# Patient Record
Sex: Female | Born: 1940 | Race: Black or African American | Hispanic: No | Marital: Single | Smoking: Never smoker
Health system: Southern US, Community
[De-identification: ages and names within clinical notes are randomized; demographics above are authoritative.]

## PROBLEM LIST (undated history)

## (undated) DIAGNOSIS — Z950 Presence of cardiac pacemaker: Secondary | ICD-10-CM

## (undated) DIAGNOSIS — I10 Essential (primary) hypertension: Secondary | ICD-10-CM

## (undated) DIAGNOSIS — E785 Hyperlipidemia, unspecified: Secondary | ICD-10-CM

## (undated) DIAGNOSIS — I2510 Atherosclerotic heart disease of native coronary artery without angina pectoris: Secondary | ICD-10-CM

## (undated) DIAGNOSIS — Z4502 Encounter for adjustment and management of automatic implantable cardiac defibrillator: Secondary | ICD-10-CM

## (undated) DIAGNOSIS — I509 Heart failure, unspecified: Secondary | ICD-10-CM

## (undated) DIAGNOSIS — N189 Chronic kidney disease, unspecified: Secondary | ICD-10-CM

## (undated) DIAGNOSIS — M8580 Other specified disorders of bone density and structure, unspecified site: Secondary | ICD-10-CM

## (undated) DIAGNOSIS — Z95810 Presence of automatic (implantable) cardiac defibrillator: Secondary | ICD-10-CM

## (undated) DIAGNOSIS — T79A3XA Traumatic compartment syndrome of abdomen, initial encounter: Secondary | ICD-10-CM

## (undated) DIAGNOSIS — E119 Type 2 diabetes mellitus without complications: Secondary | ICD-10-CM

## (undated) HISTORY — PX: INSERTION OF ICD: SHX6689

---

## 2006-02-24 ENCOUNTER — Emergency Department: Payer: Self-pay | Admitting: Emergency Medicine

## 2008-08-19 DIAGNOSIS — Z95810 Presence of automatic (implantable) cardiac defibrillator: Secondary | ICD-10-CM

## 2008-08-19 HISTORY — DX: Presence of automatic (implantable) cardiac defibrillator: Z95.810

## 2008-08-20 ENCOUNTER — Inpatient Hospital Stay: Payer: Self-pay | Admitting: Internal Medicine

## 2009-02-01 ENCOUNTER — Ambulatory Visit: Payer: Self-pay | Admitting: Family Medicine

## 2009-07-19 ENCOUNTER — Ambulatory Visit: Payer: Self-pay | Admitting: Family Medicine

## 2009-12-21 IMAGING — CR DG CHEST 1V PORT
1 series · 1 of 1 positions shown · non-contrast
Comparison: none

REASON FOR EXAM: chest pain
COMMENTS:

PROCEDURE:     DXR - DXR PORTABLE CHEST SINGLE VIEW  - August 20, 2008 [DATE]
RESULT:     The cardiac silhouette is enlarged. The pulmonary vascularity is
mild engorged mildly engorged. The lungs are well expanded. There is no
focal infiltrate.

[view not recorded]
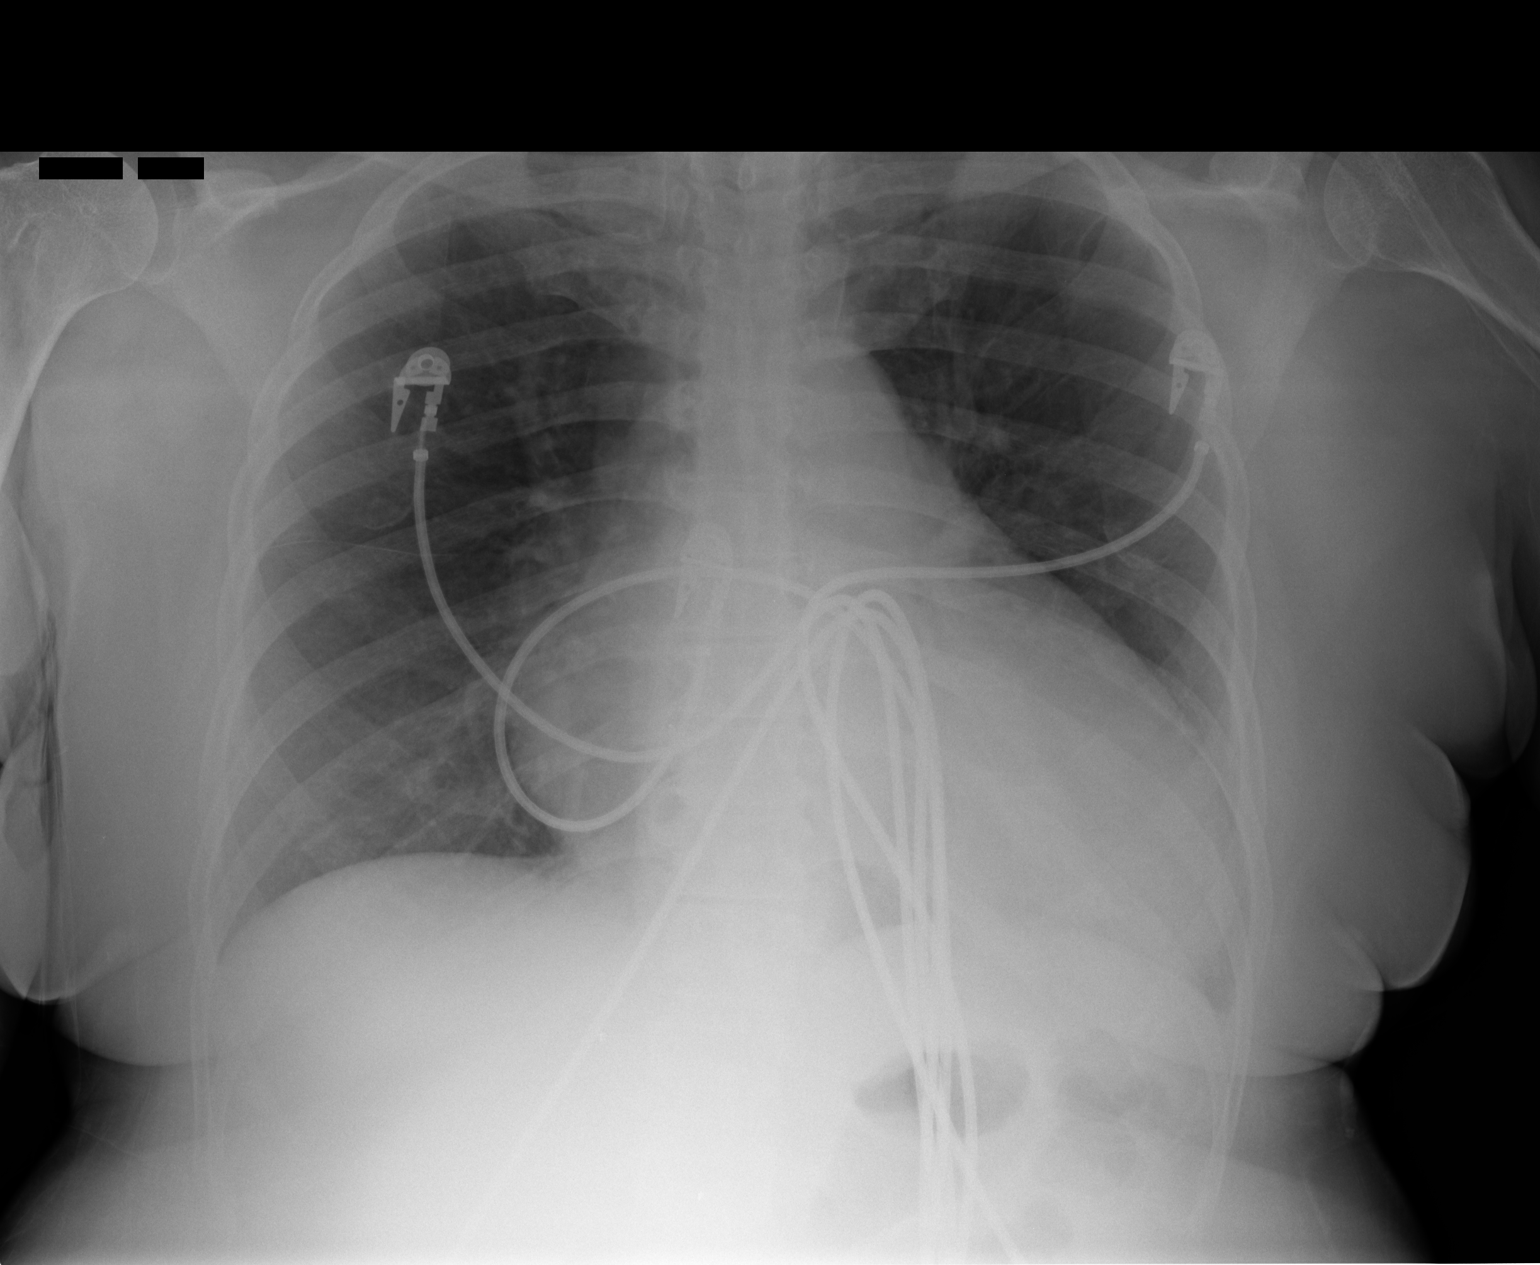

[1 of 1 positions shown; findings below may reference images not displayed]

IMPRESSION: There are findings which likely reflect an element of low-grade CHF. I do
not see evidence of pneumonia. A followup PA and lateral chest x-ray would
be a value when the patient can tolerate the procedure.

## 2013-05-19 ENCOUNTER — Ambulatory Visit: Payer: Self-pay | Admitting: Family Medicine

## 2014-06-07 ENCOUNTER — Emergency Department: Payer: Self-pay | Admitting: Emergency Medicine

## 2015-01-27 ENCOUNTER — Encounter: Payer: Self-pay | Admitting: *Deleted

## 2015-01-30 ENCOUNTER — Ambulatory Visit: Admission: RE | Admit: 2015-01-30 | Payer: Medicare Other | Source: Ambulatory Visit | Admitting: Gastroenterology

## 2015-01-30 ENCOUNTER — Encounter: Admission: RE | Payer: Self-pay | Source: Ambulatory Visit

## 2015-01-30 HISTORY — DX: Type 2 diabetes mellitus without complications: E11.9

## 2015-01-30 HISTORY — DX: Encounter for adjustment and management of automatic implantable cardiac defibrillator: Z45.02

## 2015-01-30 HISTORY — DX: Heart failure, unspecified: I50.9

## 2015-01-30 HISTORY — DX: Traumatic compartment syndrome of abdomen, initial encounter: T79.A3XA

## 2015-01-30 HISTORY — DX: Chronic kidney disease, unspecified: N18.9

## 2015-01-30 HISTORY — DX: Hyperlipidemia, unspecified: E78.5

## 2015-01-30 HISTORY — DX: Essential (primary) hypertension: I10

## 2015-01-30 HISTORY — DX: Other specified disorders of bone density and structure, unspecified site: M85.80

## 2015-01-30 SURGERY — COLONOSCOPY
Anesthesia: General

## 2015-07-03 ENCOUNTER — Other Ambulatory Visit: Payer: Self-pay | Admitting: Family Medicine

## 2015-07-03 DIAGNOSIS — Z1231 Encounter for screening mammogram for malignant neoplasm of breast: Secondary | ICD-10-CM

## 2016-09-27 ENCOUNTER — Encounter: Payer: Self-pay | Admitting: *Deleted

## 2016-09-30 ENCOUNTER — Ambulatory Visit: Payer: Medicare Other | Admitting: Anesthesiology

## 2016-09-30 ENCOUNTER — Ambulatory Visit
Admission: RE | Admit: 2016-09-30 | Discharge: 2016-09-30 | Disposition: A | Discharge status: Home / Self Care | Payer: Medicare Other | Source: Ambulatory Visit | Attending: Gastroenterology | Admitting: Gastroenterology

## 2016-09-30 ENCOUNTER — Encounter
Admission: RE | Disposition: A | Discharge status: Home / Self Care | Payer: Self-pay | Source: Ambulatory Visit | Attending: Gastroenterology

## 2016-09-30 DIAGNOSIS — E785 Hyperlipidemia, unspecified: Secondary | ICD-10-CM | POA: Insufficient documentation

## 2016-09-30 DIAGNOSIS — I081 Rheumatic disorders of both mitral and tricuspid valves: Secondary | ICD-10-CM | POA: Diagnosis not present

## 2016-09-30 DIAGNOSIS — D125 Benign neoplasm of sigmoid colon: Secondary | ICD-10-CM | POA: Insufficient documentation

## 2016-09-30 DIAGNOSIS — K644 Residual hemorrhoidal skin tags: Secondary | ICD-10-CM | POA: Diagnosis not present

## 2016-09-30 DIAGNOSIS — E669 Obesity, unspecified: Secondary | ICD-10-CM | POA: Diagnosis not present

## 2016-09-30 DIAGNOSIS — I509 Heart failure, unspecified: Secondary | ICD-10-CM | POA: Insufficient documentation

## 2016-09-30 DIAGNOSIS — E1122 Type 2 diabetes mellitus with diabetic chronic kidney disease: Secondary | ICD-10-CM | POA: Insufficient documentation

## 2016-09-30 DIAGNOSIS — M79A3 Nontraumatic compartment syndrome of abdomen: Secondary | ICD-10-CM | POA: Insufficient documentation

## 2016-09-30 DIAGNOSIS — Z794 Long term (current) use of insulin: Secondary | ICD-10-CM | POA: Diagnosis not present

## 2016-09-30 DIAGNOSIS — Z1211 Encounter for screening for malignant neoplasm of colon: Secondary | ICD-10-CM | POA: Insufficient documentation

## 2016-09-30 DIAGNOSIS — Z79899 Other long term (current) drug therapy: Secondary | ICD-10-CM | POA: Diagnosis not present

## 2016-09-30 DIAGNOSIS — Z6830 Body mass index (BMI) 30.0-30.9, adult: Secondary | ICD-10-CM | POA: Insufficient documentation

## 2016-09-30 DIAGNOSIS — I130 Hypertensive heart and chronic kidney disease with heart failure and stage 1 through stage 4 chronic kidney disease, or unspecified chronic kidney disease: Secondary | ICD-10-CM | POA: Insufficient documentation

## 2016-09-30 DIAGNOSIS — Z7982 Long term (current) use of aspirin: Secondary | ICD-10-CM | POA: Diagnosis not present

## 2016-09-30 DIAGNOSIS — N189 Chronic kidney disease, unspecified: Secondary | ICD-10-CM | POA: Diagnosis not present

## 2016-09-30 DIAGNOSIS — R011 Cardiac murmur, unspecified: Secondary | ICD-10-CM | POA: Insufficient documentation

## 2016-09-30 DIAGNOSIS — I429 Cardiomyopathy, unspecified: Secondary | ICD-10-CM | POA: Insufficient documentation

## 2016-09-30 DIAGNOSIS — M8580 Other specified disorders of bone density and structure, unspecified site: Secondary | ICD-10-CM | POA: Diagnosis not present

## 2016-09-30 DIAGNOSIS — I371 Nonrheumatic pulmonary valve insufficiency: Secondary | ICD-10-CM | POA: Insufficient documentation

## 2016-09-30 DIAGNOSIS — D120 Benign neoplasm of cecum: Secondary | ICD-10-CM | POA: Diagnosis not present

## 2016-09-30 DIAGNOSIS — Z95810 Presence of automatic (implantable) cardiac defibrillator: Secondary | ICD-10-CM | POA: Diagnosis not present

## 2016-09-30 DIAGNOSIS — I2510 Atherosclerotic heart disease of native coronary artery without angina pectoris: Secondary | ICD-10-CM | POA: Diagnosis not present

## 2016-09-30 DIAGNOSIS — Z800 Family history of malignant neoplasm of digestive organs: Secondary | ICD-10-CM | POA: Insufficient documentation

## 2016-09-30 HISTORY — DX: Presence of cardiac pacemaker: Z95.0

## 2016-09-30 HISTORY — DX: Presence of automatic (implantable) cardiac defibrillator: Z95.810

## 2016-09-30 HISTORY — DX: Atherosclerotic heart disease of native coronary artery without angina pectoris: I25.10

## 2016-09-30 HISTORY — PX: COLONOSCOPY: SHX5424

## 2016-09-30 LAB — GLUCOSE, CAPILLARY: GLUCOSE-CAPILLARY: 147 mg/dL — AB (ref 65–99)

## 2016-09-30 SURGERY — COLONOSCOPY
Anesthesia: General

## 2016-09-30 MED ORDER — SODIUM CHLORIDE 0.9 % IV SOLN
INTRAVENOUS | Status: DC
  Administered 2016-09-30: 08:00:00 via INTRAVENOUS

## 2016-09-30 MED ORDER — PROPOFOL 500 MG/50ML IV EMUL
INTRAVENOUS | Status: AC
  Filled 2016-09-30: qty 50

## 2016-09-30 MED ORDER — PHENYLEPHRINE HCL 10 MG/ML IJ SOLN
INTRAMUSCULAR | Status: DC | PRN
  Administered 2016-09-30 (×4): 100 ug via INTRAVENOUS

## 2016-09-30 MED ORDER — SODIUM CHLORIDE 0.9 % IV SOLN: INTRAVENOUS | Status: DC

## 2016-09-30 MED ORDER — PROPOFOL 10 MG/ML IV BOLUS
INTRAVENOUS | Status: DC | PRN
  Administered 2016-09-30: 30 mg via INTRAVENOUS
  Administered 2016-09-30: 20 mg via INTRAVENOUS

## 2016-09-30 MED ORDER — PROPOFOL 500 MG/50ML IV EMUL
INTRAVENOUS | Status: DC | PRN
  Administered 2016-09-30: 140 ug/kg/min via INTRAVENOUS

## 2016-09-30 MED ORDER — FENTANYL CITRATE (PF) 100 MCG/2ML IJ SOLN
INTRAMUSCULAR | Status: DC | PRN
  Administered 2016-09-30 (×2): 25 ug via INTRAVENOUS
  Administered 2016-09-30: 50 ug via INTRAVENOUS

## 2016-09-30 MED ORDER — PHENYLEPHRINE HCL 10 MG/ML IJ SOLN
INTRAMUSCULAR | Status: AC
  Filled 2016-09-30: qty 1

## 2016-09-30 MED ORDER — FENTANYL CITRATE (PF) 100 MCG/2ML IJ SOLN
INTRAMUSCULAR | Status: AC
  Filled 2016-09-30: qty 2

## 2016-09-30 NOTE — Anesthesia Post-op Follow-up Note (Cosign Needed)
Anesthesia QCDR form completed.        

## 2016-09-30 NOTE — Transfer of Care (Signed)
Immediate Anesthesia Transfer of Care Note  Patient: Erisha J Rocha  Procedure(s) Performed: Procedure(s): COLONOSCOPY (N/A)  Patient Location: PACU  Anesthesia Type:General  Level of Consciousness: awake, alert , oriented and patient cooperative  Airway & Oxygen Therapy: Patient Spontanous Breathing and Patient connected to nasal cannula oxygen  Post-op Assessment: Report given to RN, Post -op Vital signs reviewed and stable and Patient moving all extremities  Post vital signs: Reviewed and stable  Last Vitals:  Vitals:   09/30/16 0801 09/30/16 0850  BP: (!) 147/72 (!) (P) 101/54  Pulse: 84 (P) 74  Resp: 16 (P) 20  Temp: 36.7 C (P) 36.1 C    Last Pain:  Vitals:   09/30/16 0801  TempSrc: Tympanic         Complications: No apparent anesthesia complications

## 2016-09-30 NOTE — Op Note (Signed)
[facility] Gastroenterology Patient Name: Jill Keith Procedure Date: 09/30/2016 8:13 AM MRN: [id number] Account #: [id number] Date of Birth: May 13, 1941 Admit Type: Outpatient Age: 76 Room: [facility] ENDO ROOM 3 Gender: Female Note Status: Finalized Procedure:            Colonoscopy Indications:          Family history of colon cancer in a first-degree                        relative Providers:            Lollie Sails, MD Referring MD:         Neldon Labella. Ashkin MD (Referring MD) Medicines:            Monitored Anesthesia Care Complications:        No immediate complications. Procedure:            Pre-Anesthesia Assessment:                       - ASA Grade Assessment: III - A patient with severe                        systemic disease.                       After obtaining informed consent, the colonoscope was                        passed under direct vision. Throughout the procedure,                        the patient's blood pressure, pulse, and oxygen                        saturations were monitored continuously. The                        Colonoscope was introduced through the anus and                        advanced to the the cecum, identified by appendiceal                        orifice and ileocecal valve. The colonoscopy was                        performed without difficulty. The quality of the bowel                        preparation was good. Findings:      A 5 mm polyp was found in the proximal sigmoid colon. The polyp was       sessile. The polyp was removed with a cold snare. Resection and       retrieval were complete.      A 1 mm polyp was found in the cecum. The polyp was flat. The polyp was       removed with a cold biopsy forceps. Resection and retrieval were       complete.      Four sessile polyps were found in the distal sigmoid colon. The polyps  were 1 to 3 mm in size. These polyps were removed with a cold biopsy       forceps.  Resection and retrieval were complete.      The retroflexed view of the distal rectum and anal verge was normal and       showed no anal or rectal abnormalities.      The digital rectal exam was normal. Impression:           - One 5 mm polyp in the proximal sigmoid colon, removed                        with a cold snare. Resected and retrieved.                       - One 1 mm polyp in the cecum, removed with a cold                        biopsy forceps. Resected and retrieved.                       - Four 1 to 3 mm polyps in the distal sigmoid colon,                        removed with a cold biopsy forceps. Resected and                        retrieved.                       - The distal rectum and anal verge are normal on                        retroflexion view. Recommendation:       - Discharge patient to home.                       - Telephone GI clinic for pathology results in 1 week. Procedure Code(s):    --- Professional ---                       [id number], Colonoscopy, flexible; with removal of tumor(s),                        polyp(s), or other lesion(s) by snare technique                       45380, 54, Colonoscopy, flexible; with biopsy, single                        or multiple Diagnosis Code(s):    --- Professional ---                       D12.5, Benign neoplasm of sigmoid colon                       D12.0, Benign neoplasm of cecum                       Z80.0, Family history of malignant neoplasm of  digestive organs CPT copyright 2016 American Medical Association. All rights reserved. The codes documented in this report are preliminary and upon coder review may  be revised to meet current compliance requirements. Lollie Sails, MD 09/30/2016 8:48:48 AM This report has been signed electronically. Number of Addenda: 0 Note Initiated On: 09/30/2016 8:13 AM Scope Withdrawal Time: 0 hours 9 minutes 12 seconds  Total Procedure Duration: 0 hours 18  minutes 23 seconds       [facility]

## 2016-09-30 NOTE — Anesthesia Postprocedure Evaluation (Signed)
Anesthesia Post Note  Patient: Jill Keith  Procedure(s) Performed: Procedure(s) (LRB): COLONOSCOPY (N/A)  Patient location during evaluation: Endoscopy Anesthesia Type: General Level of consciousness: awake and alert and oriented Pain management: pain level controlled Vital Signs Assessment: post-procedure vital signs reviewed and stable Respiratory status: spontaneous breathing, nonlabored ventilation and respiratory function stable Cardiovascular status: blood pressure returned to baseline and stable Postop Assessment: no signs of nausea or vomiting Anesthetic complications: no     Last Vitals:  Vitals:   09/30/16 0850 09/30/16 0900  BP: (!) 101/54 109/74  Pulse: 74 78  Resp: 20 19  Temp: 36.1 C     Last Pain:  Vitals:   09/30/16 0850  TempSrc: Tympanic                 Kaeo Jacome

## 2016-09-30 NOTE — H&P (Signed)
Outpatient short stay form Pre-procedure 09/30/2016 8:16 AM Lollie Sails MD  Primary Physician: Dr Yolonda Kida  Reason for visit:  Colonoscopy  History of present illness:  Patient is a 76 year old female presenting today as above. She has family history of colon cancer in her mother. Patient has never had a colonoscopy in the past. She tolerated her prep well. She takes no blood thinning agents with the exception of 81 mg aspirin that she has held for several days.    Current Facility-Administered Medications:  .  0.9 %  sodium chloride infusion, , Intravenous, Continuous, Lollie Sails, MD .  0.9 %  sodium chloride infusion, , Intravenous, Continuous, Lollie Sails, MD  Prescriptions Prior to Admission  Medication Sig Dispense Refill Last Dose  . atorvastatin (LIPITOR) 80 MG tablet Take 80 mg by mouth at bedtime.   09/28/2016 at Unknown time  . carvedilol (COREG) 25 MG tablet Take 25 mg by mouth every 12 (twelve) hours.   09/29/2016 at Unknown time  . eplerenone (INSPRA) 25 MG tablet Take 25 mg by mouth daily.   09/29/2016 at Unknown time  . aspirin EC 81 MG tablet Take 81 mg by mouth daily.   09/26/2016  . Calcium Carb-Cholecalciferol (CALCIUM 600 + D PO) Take 1 tablet by mouth 2 (two) times daily.   09/21/2016  . ferrous sulfate 325 (65 FE) MG tablet Take 325 mg by mouth daily with breakfast.   09/26/2016  . furosemide (LASIX) 40 MG tablet Take 40 mg by mouth as needed for fluid.     Marland Kitchen insulin detemir (LEVEMIR) 100 UNIT/ML injection Inject 100 Units into the skin at bedtime.   09/28/2016  . lisinopril (PRINIVIL,ZESTRIL) 20 MG tablet Take 20 mg by mouth daily.     . nitroGLYCERIN (NITROSTAT) 0.4 MG SL tablet Place 0.4 mg under the tongue every 5 (five) minutes as needed for chest pain.        No Known Allergies   Past Medical History:  Diagnosis Date  . ACS (abdominal compartment syndrome)   . AICD (automatic cardioverter/defibrillator) present 2010  . CHF (congestive heart  failure) ([place])   . Coronary artery disease    Cardiomyopathy  . CRI (chronic renal insufficiency)    CRI  . Diabetes mellitus without complication ([place])   . Dyslipidemia   . Hypertension   . ICD (implantable cardioverter-defibrillator) battery depletion   . Osteopenia   . Presence of permanent cardiac pacemaker     Review of systems:      Physical Exam    Heart and lungs: Regular rate and rhythm without rub or gallop, lungs are bilaterally clear.    HEENT: Normocephalic atraumatic eyes are anicteric    Other:     Pertinant exam for procedure: Soft nontender nondistended bowel sounds positive normoactive    Planned proceedures: Colonoscopy and indicated procedures. I have discussed the risks benefits and complications of procedures to include not limited to bleeding, infection, perforation and the risk of sedation and the patient wishes to proceed.    Lollie Sails, MD Gastroenterology 09/30/2016  8:16 AM

## 2016-09-30 NOTE — Anesthesia Preprocedure Evaluation (Signed)
Anesthesia Evaluation  Patient identified by MRN, date of birth, ID band Patient awake    Reviewed: Allergy & Precautions, NPO status , Patient's Chart, lab work & pertinent test results  History of Anesthesia Complications Negative for: history of anesthetic complications  Airway Mallampati: III  TM Distance: >3 FB Neck ROM: Full    Dental  (+) Lower Dentures, Upper Dentures   Pulmonary neg pulmonary ROS, neg sleep apnea, neg COPD,    breath sounds clear to auscultation- rhonchi (-) wheezing      Cardiovascular hypertension, + CAD, + Cardiac Stents (DES to LAD in 08/2008) and +CHF (EF 20%)  + pacemaker + Cardiac Defibrillator  Rhythm:Regular Rate:Normal - Systolic murmurs and - Diastolic murmurs Echo [id number]: SEVERE LV DYSFUNCTION (EF 30%) WITH MODERATE LVH NORMAL LA PRESSURES WITH DIASTOLIC DYSFUNCTION NORMAL RIGHT VENTRICULAR SYSTOLIC FUNCTION VALVULAR REGURGITATION: MILD MR, MILD PR, MILD TR NO VALVULAR STENOSIS   Neuro/Psych negative neurological ROS  negative psych ROS   GI/Hepatic   Endo/Other  diabetes, Insulin Dependent  Renal/GU Renal InsufficiencyRenal disease     Musculoskeletal negative musculoskeletal ROS (+)   Abdominal (+) + obese,   Peds  Hematology negative hematology ROS (+)   Anesthesia Other Findings   Reproductive/Obstetrics                             Anesthesia Physical Anesthesia Plan  ASA: III  Anesthesia Plan: General   Post-op Pain Management:    Induction: Intravenous  Airway Management Planned: Natural Airway  Additional Equipment:   Intra-op Plan:   Post-operative Plan:   Informed Consent: I have reviewed the patients History and Physical, chart, labs and discussed the procedure including the risks, benefits and alternatives for the proposed anesthesia with the patient or authorized representative who has indicated his/her  understanding and acceptance.   Dental advisory given  Plan Discussed with: CRNA and Anesthesiologist  Anesthesia Plan Comments:         Anesthesia Quick Evaluation

## 2016-10-01 ENCOUNTER — Encounter: Payer: Self-pay | Admitting: Gastroenterology

## 2016-10-01 LAB — SURGICAL PATHOLOGY

## 2016-10-03 ENCOUNTER — Other Ambulatory Visit: Payer: Self-pay | Admitting: Family Medicine

## 2016-10-03 DIAGNOSIS — M8580 Other specified disorders of bone density and structure, unspecified site: Secondary | ICD-10-CM

## 2016-11-22 ENCOUNTER — Other Ambulatory Visit: Payer: Self-pay | Admitting: Family Medicine

## 2016-11-22 DIAGNOSIS — Z1231 Encounter for screening mammogram for malignant neoplasm of breast: Secondary | ICD-10-CM

## 2016-12-10 ENCOUNTER — Ambulatory Visit: Payer: Medicare Other

## 2017-01-28 ENCOUNTER — Ambulatory Visit
Admission: RE | Admit: 2017-01-28 | Discharge: 2017-01-28 | Disposition: A | Discharge status: Home / Self Care | Payer: Medicare Other | Source: Ambulatory Visit | Attending: Family Medicine | Admitting: Family Medicine

## 2017-01-28 DIAGNOSIS — M8580 Other specified disorders of bone density and structure, unspecified site: Secondary | ICD-10-CM | POA: Diagnosis present

## 2017-01-28 DIAGNOSIS — Z1231 Encounter for screening mammogram for malignant neoplasm of breast: Secondary | ICD-10-CM | POA: Diagnosis not present

## 2017-01-28 DIAGNOSIS — M85852 Other specified disorders of bone density and structure, left thigh: Secondary | ICD-10-CM | POA: Insufficient documentation

## 2019-04-07 ENCOUNTER — Other Ambulatory Visit: Payer: Self-pay | Admitting: Student

## 2019-04-07 DIAGNOSIS — Z1231 Encounter for screening mammogram for malignant neoplasm of breast: Secondary | ICD-10-CM

## 2019-06-08 ENCOUNTER — Other Ambulatory Visit: Payer: Self-pay

## 2019-06-08 ENCOUNTER — Encounter: Payer: Self-pay | Admitting: Emergency Medicine

## 2019-06-08 DIAGNOSIS — Z950 Presence of cardiac pacemaker: Secondary | ICD-10-CM | POA: Diagnosis not present

## 2019-06-08 DIAGNOSIS — E119 Type 2 diabetes mellitus without complications: Secondary | ICD-10-CM | POA: Insufficient documentation

## 2019-06-08 DIAGNOSIS — I259 Chronic ischemic heart disease, unspecified: Secondary | ICD-10-CM | POA: Diagnosis not present

## 2019-06-08 DIAGNOSIS — Z794 Long term (current) use of insulin: Secondary | ICD-10-CM | POA: Diagnosis not present

## 2019-06-08 DIAGNOSIS — Z79899 Other long term (current) drug therapy: Secondary | ICD-10-CM | POA: Insufficient documentation

## 2019-06-08 DIAGNOSIS — I110 Hypertensive heart disease with heart failure: Secondary | ICD-10-CM | POA: Diagnosis present

## 2019-06-08 DIAGNOSIS — I509 Heart failure, unspecified: Secondary | ICD-10-CM | POA: Insufficient documentation

## 2019-06-08 DIAGNOSIS — Z7982 Long term (current) use of aspirin: Secondary | ICD-10-CM | POA: Insufficient documentation

## 2019-06-08 LAB — CBC
HCT: 35.4 % — ABNORMAL LOW (ref 36.0–46.0)
Hemoglobin: 11.4 g/dL — ABNORMAL LOW (ref 12.0–15.0)
MCH: 31.6 pg (ref 26.0–34.0)
MCHC: 32.2 g/dL (ref 30.0–36.0)
MCV: 98.1 fL (ref 80.0–100.0)
Platelets: 234 10*3/uL (ref 150–400)
RBC: 3.61 MIL/uL — ABNORMAL LOW (ref 3.87–5.11)
RDW: 13.2 % (ref 11.5–15.5)
WBC: 5.2 10*3/uL (ref 4.0–10.5)
nRBC: 0 % (ref 0.0–0.2)

## 2019-06-08 LAB — COMPREHENSIVE METABOLIC PANEL
ALT: 13 U/L (ref 0–44)
AST: 20 U/L (ref 15–41)
Albumin: 3.8 g/dL (ref 3.5–5.0)
Alkaline Phosphatase: 68 U/L (ref 38–126)
Anion gap: 12 (ref 5–15)
BUN: 16 mg/dL (ref 8–23)
CO2: 22 mmol/L (ref 22–32)
Calcium: 9 mg/dL (ref 8.9–10.3)
Chloride: 106 mmol/L (ref 98–111)
Creatinine, Ser: 1.16 mg/dL — ABNORMAL HIGH (ref 0.44–1.00)
GFR calc Af Amer: 53 mL/min — ABNORMAL LOW (ref >60)
GFR calc non Af Amer: 45 mL/min — ABNORMAL LOW (ref >60)
Glucose, Bld: 136 mg/dL — ABNORMAL HIGH (ref 70–99)
Potassium: 4.3 mmol/L (ref 3.5–5.1)
Sodium: 140 mmol/L (ref 135–145)
Total Bilirubin: 0.9 mg/dL (ref 0.3–1.2)
Total Protein: 7.7 g/dL (ref 6.5–8.1)

## 2019-06-08 LAB — TROPONIN I (HIGH SENSITIVITY): Troponin I (High Sensitivity): 7 ng/L (ref <18)

## 2019-06-08 NOTE — ED Triage Notes (Signed)
Pt to ER via EMS [place] and noted to be HTN.  Pt has hx of same, denies symptoms at this time.

## 2019-06-09 ENCOUNTER — Emergency Department
Admission: EM | Admit: 2019-06-09 | Discharge: 2019-06-09 | Disposition: A | Discharge status: Home / Self Care | Payer: Medicare Other | Attending: Emergency Medicine | Admitting: Emergency Medicine

## 2019-06-09 DIAGNOSIS — I10 Essential (primary) hypertension: Secondary | ICD-10-CM

## 2019-06-09 NOTE — Discharge Instructions (Signed)
As we discussed, though you do have high blood pressure (hypertension), fortunately it is not immediately dangerous at this time and does not need emergency intervention or admission to the hospital.  If we add to or change your regular medications, we may cause more harm than good - it is more appropriate for your primary care doctor to evaluate you in clinic and decide if any medication changes are needed.  Please follow up in clinic as recommended in these papers.      When you get home, take your nighttime medications.  Try to sleep in a bit in the morning (it's already 3:00am!), and when you get up, take your regular morning medications.  Call your primary care doctor to schedule a follow up appointment.  Return to the Emergency Department (ED) if you experience any worsening chest pain/pressure/tightness, difficulty breathing, or sudden sweating, or other symptoms that concern you.

## 2019-06-09 NOTE — ED Provider Notes (Signed)
[facility] Emergency Department Provider Note  ____________________________________________   First MD Initiated Contact with Patient 06/09/19 0220     (approximate)  I have reviewed the triage vital signs and the nursing notes.   HISTORY  Chief Complaint Hypertension    HPI Jill Keith is a 78 y.o. female with medical issues as listed below who presents for evaluation of high blood pressure.  She reports that she was not having any symptoms and had a normal day today, but her son came over and checked his blood pressure so she asked him to check hers as well.  It was elevated so he brought her to EMS and they checked her blood pressure and confirmed it was high, so they brought her to the ED.  She says that she has had no symptoms and had a normal day yesterday although she admits that she had some food from [place] last night but otherwise denies dietary indiscretions.  She is compliant with her regular medications and her blood pressure medicine includes carvedilol and Inspra.  She says that her primary care doctor is the one that prescribes her medications but she also has a cardiologist at [facility].  She denies headache, visual changes, sore throat, chest pain, shortness of breath, cough, nausea, vomiting, and abdominal pain.  She has had no contact with COVID-19 patients.  Nothing in particular makes her symptoms better or worse and she describes the blood pressure elevation is severe but otherwise is asymptomatic.         Past Medical History:  Diagnosis Date  . ACS (abdominal compartment syndrome)   . AICD (automatic cardioverter/defibrillator) present 2010  . CHF (congestive heart failure) ([place])   . Coronary artery disease    Cardiomyopathy  . CRI (chronic renal insufficiency)    CRI  . Diabetes mellitus without complication ([place])   . Dyslipidemia   . Hypertension   . ICD (implantable cardioverter-defibrillator) battery depletion   .  Osteopenia   . Presence of permanent cardiac pacemaker     There are no active problems to display for this patient.   Past Surgical History:  Procedure Laterality Date  . COLONOSCOPY N/A 09/30/2016   Procedure: COLONOSCOPY;  Surgeon: Lollie Sails, MD;  Location: [facility] ENDOSCOPY;  Service: Endoscopy;  Laterality: N/A;  . INSERTION OF ICD      Prior to Admission medications   Medication Sig Start Date End Date Taking? Authorizing Provider  aspirin EC 81 MG tablet Take 81 mg by mouth daily.    [provider]  atorvastatin (LIPITOR) 80 MG tablet Take 80 mg by mouth at bedtime.    [provider]  Calcium Carb-Cholecalciferol (CALCIUM 600 + D PO) Take 1 tablet by mouth 2 (two) times daily.    [provider]  carvedilol (COREG) 25 MG tablet Take 25 mg by mouth every 12 (twelve) hours.    [provider]  eplerenone (INSPRA) 25 MG tablet Take 25 mg by mouth daily.    [provider]  ferrous sulfate 325 (65 FE) MG tablet Take 325 mg by mouth daily with breakfast.    [provider]  furosemide (LASIX) 40 MG tablet Take 40 mg by mouth as needed for fluid.    [provider]  insulin detemir (LEVEMIR) 100 UNIT/ML injection Inject 100 Units into the skin at bedtime.    [provider]  lisinopril (PRINIVIL,ZESTRIL) 20 MG tablet Take 20 mg by mouth daily.    [provider]  nitroGLYCERIN (NITROSTAT) 0.4 MG SL tablet Place 0.4 mg under the tongue every 5 (five) minutes as needed for chest pain.    [provider]    Allergies Patient has no known allergies.  History reviewed. No pertinent family history.  Social History Social History   Tobacco Use  . Smoking status: Never Smoker  . Smokeless tobacco: Never Used  Substance Use Topics  . Alcohol use: No  . Drug use: No    Review of Systems Constitutional: Hypertension.  No fever/chills Eyes: No visual changes. ENT: No sore throat.  Cardiovascular: Denies chest pain. Respiratory: Denies shortness of breath. Gastrointestinal: No abdominal pain.  No nausea, no vomiting.  No diarrhea.  No constipation. Genitourinary: Negative for dysuria. Musculoskeletal: Negative for neck pain.  Negative for back pain. Integumentary: Negative for rash. Neurological: Negative for headaches, focal weakness or numbness.   ____________________________________________   PHYSICAL EXAM:  VITAL SIGNS: ED Triage Vitals  Enc Vitals Group     BP 06/08/19 2205 (!) 196/101     Pulse Rate 06/08/19 2203 84     Resp 06/08/19 2203 18     Temp 06/08/19 2203 99.2 F (37.3 C)     Temp src --      SpO2 06/08/19 2203 99 %     Weight 06/08/19 2203 76.2 kg (168 lb)     Height 06/08/19 2203 1.676 m (5\' 6" )     Head Circumference --      Peak Flow --      Pain Score 06/08/19 2203 0     Pain Loc --      Pain Edu? --      Excl. in [place]? --     Constitutional: Alert and oriented.  Well-appearing and in no distress. Eyes: Conjunctivae are normal.  Head: Atraumatic. Nose: No congestion/rhinnorhea. Mouth/Throat: Mucous membranes are moist. Neck: No stridor.  No meningeal signs.   Cardiovascular: Normal rate, regular rhythm. Good peripheral circulation. Grossly normal heart sounds. Respiratory: Normal respiratory effort.  No retractions. Gastrointestinal: Soft and nontender. No distention.  Musculoskeletal: No lower extremity tenderness nor edema. No gross deformities of extremities. Neurologic:  Normal speech and language. No gross focal neurologic deficits are appreciated.  Skin:  Skin is warm, dry and intact. Psychiatric: Mood and affect are normal. Speech and behavior are normal.  ____________________________________________   LABS (all labs ordered are listed, but only abnormal results are displayed)  Labs Reviewed  CBC - Abnormal; Notable for the following components:      Result Value   RBC 3.61 (*)    Hemoglobin 11.4 (*)    HCT  35.4 (*)    All other components within normal limits  COMPREHENSIVE METABOLIC PANEL - Abnormal; Notable for the following components:   Glucose, Bld 136 (*)    Creatinine, Ser 1.16 (*)    GFR calc non Af Amer 45 (*)    GFR calc Af Amer 53 (*)    All other components within normal limits  TROPONIN I (HIGH SENSITIVITY)   ____________________________________________  EKG  ED ECG REPORT I, Hinda Kehr, the attending physician, personally viewed and interpreted this ECG.  Date: 06/08/2019 EKG Time: 22:08 Rate: 82 Rhythm: normal sinus rhythm QRS Axis: normal Intervals: incomplete LBBB ST/T Wave abnormalities: Non-specific ST segment / T-wave changes, but no clear evidence of acute ischemia. Narrative Interpretation: no definitive evidence of acute ischemia; does not meet STEMI criteria.   ____________________________________________  RADIOLOGY Ursula Alert, personally viewed and  evaluated these images (plain radiographs) as part of my medical decision making, as well as reviewing the written report by the radiologist.  ED MD interpretation: No indication for emergent imaging  Official radiology report(s): No results found.  ____________________________________________   PROCEDURES   Procedure(s) performed (including Critical Care):  Procedures   ____________________________________________   INITIAL IMPRESSION / MDM / [place] / ED COURSE  As part of my medical decision making, I reviewed the following data within the [place] notes reviewed and incorporated, Labs reviewed , EKG interpreted , Old chart reviewed and Notes from prior ED visits   Differential diagnosis includes, but is not limited to, asymptomatic (essential) hypertension, medication side effect, dietary indiscretion, acute infection, CVA, ACS, malignant hypertension.  The patient has been waiting for about 5 hours and has remained asymptomatic.  Her blood  pressure has remained elevated from the time of triage to the now, and it is substantially elevated, but in the 5 hours she has been here she also missed all of her evening medications.  Given that she is completely asymptomatic with a normal EKG and normal lab work including a creatinine of 1.16 which is lower than most of the creatinines on record in care everywhere from her follow-ups at [facility], she has no evidence of any endorgan dysfunction and no evidence of malignant hypertension or hypertensive urgency.  I discussed with her my recommendation that she go home and take her regular blood pressure (evening) medications, try to sleep in the morning, and then take her morning medications as well.  I encouraged her to follow up with her primary care doctor with a phone call to schedule the next available appointment when she gets up in the morning.  She understands and agrees with the plan and I gave my usual and customary return precautions.  Rather than treating her with the same medications she has at home while she is in the emergency department, she is comfortable taking her regular meds at home.          ____________________________________________  FINAL CLINICAL IMPRESSION(S) / ED DIAGNOSES  Final diagnoses:  Asymptomatic hypertension     MEDICATIONS GIVEN DURING THIS VISIT:  Medications - No data to display   ED Discharge Orders    None      *Please note:  Jill Keith was evaluated in Emergency Department on 06/09/2019 for the symptoms described in the history of present illness. She was evaluated in the context of the global COVID-19 pandemic, which necessitated consideration that the patient might be at risk for infection with the SARS-CoV-2 virus that causes COVID-19. Institutional protocols and algorithms that pertain to the evaluation of patients at risk for COVID-19 are in a state of rapid change based on information released by regulatory bodies including the CDC and  federal and state organizations. These policies and algorithms were followed during the patient's care in the ED.  Some ED evaluations and interventions may be delayed as a result of limited staffing during the pandemic.*  Note:  This document was prepared using Dragon voice recognition software and may include unintentional dictation errors.   Hinda Kehr, MD 06/09/19 [phone]

## 2021-03-27 ENCOUNTER — Other Ambulatory Visit: Payer: Self-pay | Admitting: Student in an Organized Health Care Education/Training Program

## 2022-02-15 ENCOUNTER — Ambulatory Visit
Admission: EM | Admit: 2022-02-15 | Discharge: 2022-02-15 | Disposition: A | Discharge status: Home / Self Care | Payer: Medicare Other

## 2022-02-15 ENCOUNTER — Encounter: Payer: Self-pay | Admitting: Emergency Medicine

## 2022-02-15 DIAGNOSIS — M79671 Pain in right foot: Secondary | ICD-10-CM

## 2022-02-15 MED ORDER — NAPROXEN 375 MG PO TABS: 375.0000 mg | ORAL_TABLET | Freq: Two times a day (BID) | ORAL | 0 refills | Status: AC

## 2022-02-15 MED ORDER — KETOROLAC TROMETHAMINE 60 MG/2ML IM SOLN
30.0000 mg | Freq: Once | INTRAMUSCULAR | Status: AC
  Administered 2022-02-15: 30 mg via INTRAMUSCULAR

## 2022-02-15 NOTE — ED Provider Notes (Signed)
[facility]    CSN: [id number] Arrival date & time: 02/15/22  1417      History   Chief Complaint Chief Complaint  Patient presents with   Foot Pain    right    HPI Jill Keith is a 81 y.o. female.   Patient presents with pain to the top of the foot and to the sides of the ankle occurring intermittently for 1 month.  Associated intermittent burning sensation to the bottom of the foot extending into the lower leg.  Painful to bear weight.  Range of motion is intact.  Has attempted use of diclofenac gel which has been somewhat effective.  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  Denies numbness or tingling, injury or trauma.   Past Medical History:  Diagnosis Date   ACS (abdominal compartment syndrome)    AICD (automatic cardioverter/defibrillator) present 2010   CHF (congestive heart failure) (HCC)    Coronary artery disease    Cardiomyopathy   CRI (chronic renal insufficiency)    CRI   Diabetes mellitus without complication (HCC)    Dyslipidemia    Hypertension    ICD (implantable cardioverter-defibrillator) battery depletion    Osteopenia    Presence of permanent cardiac pacemaker     There are no problems to display for this patient.   Past Surgical History:  Procedure Laterality Date   COLONOSCOPY N/A 09/30/2016   Procedure: COLONOSCOPY;  Surgeon: Lollie Sails, MD;  Location: [facility] ENDOSCOPY;  Service: Endoscopy;  Laterality: N/A;   INSERTION OF ICD      OB History   No obstetric history on file.      Home Medications    Prior to Admission medications   Medication Sig Start Date End Date Taking? Authorizing Provider  amLODipine (NORVASC) 5 MG tablet Take 5 mg by mouth daily. 01/23/22  Yes [provider]  aspirin EC 81 MG tablet Take 81 mg by mouth daily.   Yes [provider]  atorvastatin (LIPITOR) 80 MG tablet Take 80 mg by mouth at bedtime.   Yes [provider]  Calcium Carb-Cholecalciferol (CALCIUM 600 +  D PO) Take 1 tablet by mouth 2 (two) times daily.   Yes [provider]  carvedilol (COREG) 25 MG tablet Take 25 mg by mouth every 12 (twelve) hours.   Yes [provider]  eplerenone (INSPRA) 25 MG tablet Take 25 mg by mouth daily.   Yes [provider]  furosemide (LASIX) 40 MG tablet Take 40 mg by mouth as needed for fluid.   Yes [provider]  insulin detemir (LEVEMIR) 100 UNIT/ML injection Inject 100 Units into the skin at bedtime.   Yes [provider]  JARDIANCE 10 MG TABS tablet Take 10 mg by mouth daily. 02/01/22  Yes [provider]  lisinopril (PRINIVIL,ZESTRIL) 20 MG tablet Take 20 mg by mouth daily.   Yes [provider]  ferrous sulfate 325 (65 FE) MG tablet Take 325 mg by mouth daily with breakfast.    [provider]  nitroGLYCERIN (NITROSTAT) 0.4 MG SL tablet Place 0.4 mg under the tongue every 5 (five) minutes as needed for chest pain.    [provider]    Family History History reviewed. No pertinent family history.  Social History Social History   Tobacco Use   Smoking status: Never   Smokeless tobacco: Never  Vaping Use   Vaping Use: Never used  Substance Use Topics   Alcohol use: No  Drug use: No     Allergies   Patient has no known allergies.   Review of Systems Review of Systems  Constitutional: Negative.   Respiratory: Negative.    Cardiovascular: Negative.   Musculoskeletal:  Positive for myalgias. Negative for arthralgias, back pain, gait problem, joint swelling, neck pain and neck stiffness.  Skin: Negative.   Neurological: Negative.      Physical Exam Triage Vital Signs ED Triage Vitals  Enc Vitals Group     BP 02/15/22 1450 132/75     Pulse Rate 02/15/22 1450 82     Resp 02/15/22 1450 14     Temp 02/15/22 1450 98.2 F (36.8 C)     Temp Source 02/15/22 1450 Oral     SpO2 02/15/22 1450 96 %     Weight 02/15/22 1448 165 lb (74.8 kg)     Height 02/15/22  1448 '5\' 2"'$  (1.575 m)     Head Circumference --      Peak Flow --      Pain Score 02/15/22 1447 8     Pain Loc --      Pain Edu? --      Excl. in [place]? --    No data found.  Updated Vital Signs BP 132/75 (BP Location: Left Arm)   Pulse 82   Temp 98.2 F (36.8 C) (Oral)   Resp 14   Ht '5\' 2"'$  (1.575 m)   Wt 165 lb (74.8 kg)   SpO2 96%   BMI 30.18 kg/m   Visual Acuity Right Eye Distance:   Left Eye Distance:   Bilateral Distance:    Right Eye Near:   Left Eye Near:    Bilateral Near:     Physical Exam Constitutional:      Appearance: Normal appearance.  Eyes:     Extraocular Movements: Extraocular movements intact.  Pulmonary:     Effort: Pulmonary effort is normal.  Musculoskeletal:     Comments: Tenderness is present throughout the anterior of the right foot and the lateral and medial aspects of the right ankle without point tenderness noted, no ecchymosis, swelling or deformity present, able to bear weight, range of motion intact, 2+ dorsalis pedis and pedal pulse, sensation intact  Neurological:     Mental Status: She is alert and oriented to person, place, and time. Mental status is at baseline.  Psychiatric:        Mood and Affect: Mood normal.        Behavior: Behavior normal.      UC Treatments / Results  Labs (all labs ordered are listed, but only abnormal results are displayed) Labs Reviewed - No data to display  EKG   Radiology No results found.  Procedures Procedures (including critical care time)  Medications Ordered in UC Medications - No data to display  Initial Impression / Assessment and Plan / UC Course  I have reviewed the triage vital signs and the nursing notes.  Pertinent labs & imaging results that were available during my care of the patient were reviewed by me and considered in my medical decision making (see chart for details).  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   Hans Eden, NP 02/15/22 1511

## 2022-02-15 NOTE — Discharge Instructions (Signed)
I believe today that your pain is most likely related to an arthritic flare which is an inflammatory process that will exacerbate your pain therefore the medications that we are using today are helping to reduce the inflammation which in turn will help with your pain, I have low suspicion of involvement of your bone due to lack of injury and have low suspicion for infection or gout due to lack of swelling, redness, fevers and warmth to the skin  You have been given an injection of Toradol here today in the office to reduce inflammation  Starting tomorrow begin naproxen every morning and every evening for 5 days then as needed, this is to continue the process as above  You may continue use of diclofenac cream, you may also take Tylenol in addition  You may use heating pad in 15 minute intervals as needed for additional comfort, or you may find comfort in using ice in 10-15 minutes over affected area  Elevate your foot on 2 pillows while sitting and lying to help reduce swelling  If pain persist after recommended treatment or reoccurs if may be beneficial to follow up with orthopedic specialist for evaluation, this doctor specializes in the bones and can manage your symptoms long-term with options such as but not limited to imaging, medications or physical therapy

## 2022-02-15 NOTE — ED Triage Notes (Signed)
Patient c/o right foot pain that started getting worse a month ago.  Patient has history of arthritis in her right foot.  Patient denies any recent injury or fall. Patient has been applying cream to the area for the pain.
